# Patient Record
Sex: Male | Born: 1960 | Race: Black or African American | Hispanic: No | Marital: Married | Smoking: Current every day smoker
Health system: Southern US, Community
[De-identification: ages and names within clinical notes are randomized; demographics above are authoritative.]

## PROBLEM LIST (undated history)

## (undated) DIAGNOSIS — I10 Essential (primary) hypertension: Secondary | ICD-10-CM

## (undated) DIAGNOSIS — J45909 Unspecified asthma, uncomplicated: Secondary | ICD-10-CM

## (undated) DIAGNOSIS — M549 Dorsalgia, unspecified: Secondary | ICD-10-CM

## (undated) HISTORY — PX: APPENDECTOMY: SHX54

---

## 2018-02-01 ENCOUNTER — Ambulatory Visit
Admission: RE | Admit: 2018-02-01 | Discharge: 2018-02-01 | Disposition: A | Discharge status: Home / Self Care | Payer: Medicare Other | Source: Ambulatory Visit | Attending: Student | Admitting: Student

## 2018-02-01 ENCOUNTER — Ambulatory Visit
Admission: RE | Admit: 2018-02-01 | Discharge: 2018-02-01 | Disposition: A | Discharge status: Home / Self Care | Payer: Medicare Other | Source: Ambulatory Visit | Attending: *Deleted | Admitting: *Deleted

## 2018-02-01 ENCOUNTER — Other Ambulatory Visit: Payer: Self-pay | Admitting: *Deleted

## 2018-02-01 DIAGNOSIS — R05 Cough: Secondary | ICD-10-CM | POA: Diagnosis present

## 2018-02-01 DIAGNOSIS — J40 Bronchitis, not specified as acute or chronic: Secondary | ICD-10-CM | POA: Insufficient documentation

## 2018-02-01 DIAGNOSIS — R059 Cough, unspecified: Secondary | ICD-10-CM

## 2018-02-01 IMAGING — CR DG CHEST 2V
1 series · 2 of 2 positions shown · non-contrast
Comparison: None

CLINICAL DATA: Productive cough for 2 months, some chest pain from
coughing, smoker

EXAM:
CHEST  2 VIEW

[Series 1: dg chest 2 view · 0.14mm/px · 2 of 2 slices shown]
[im 1/2]
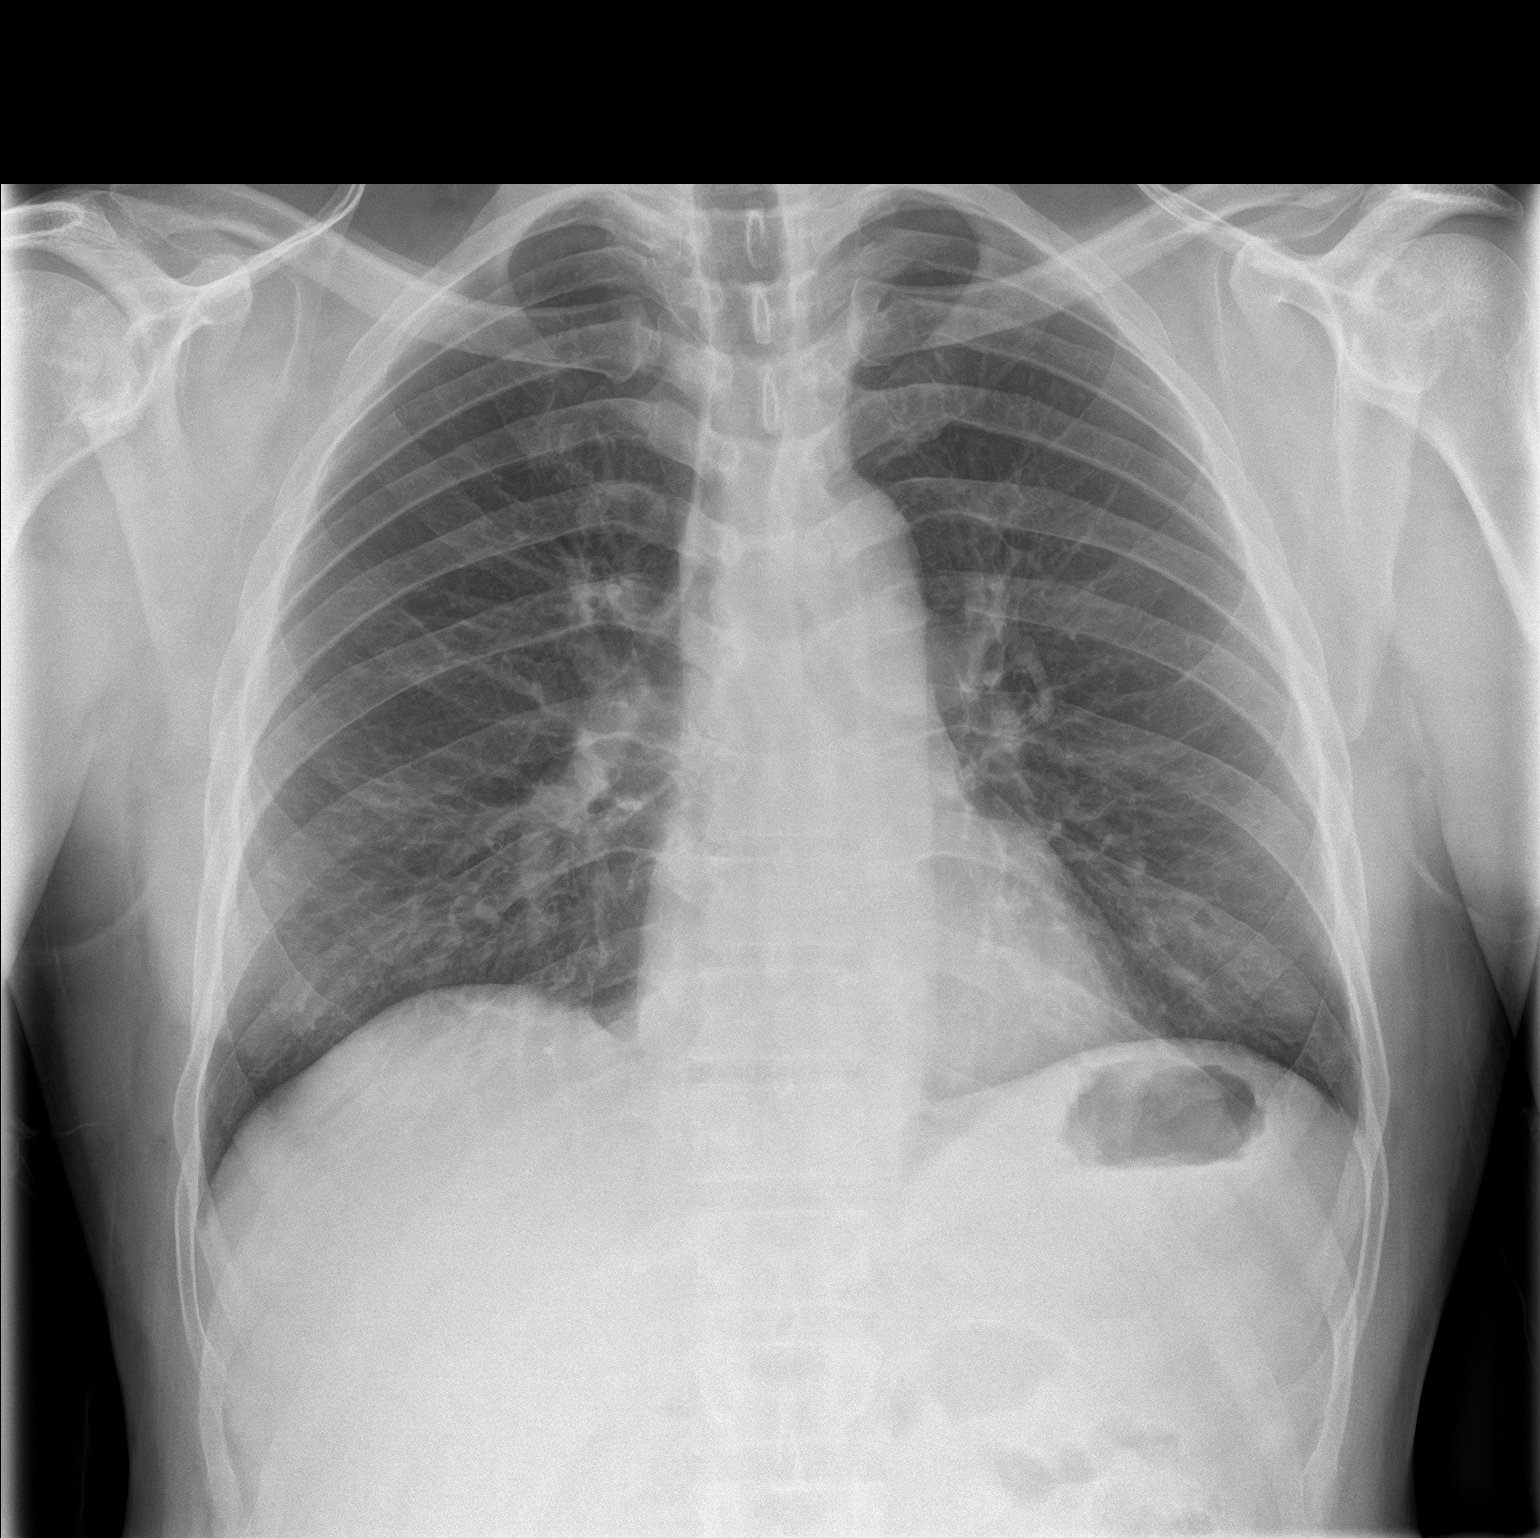
[im 2/2]
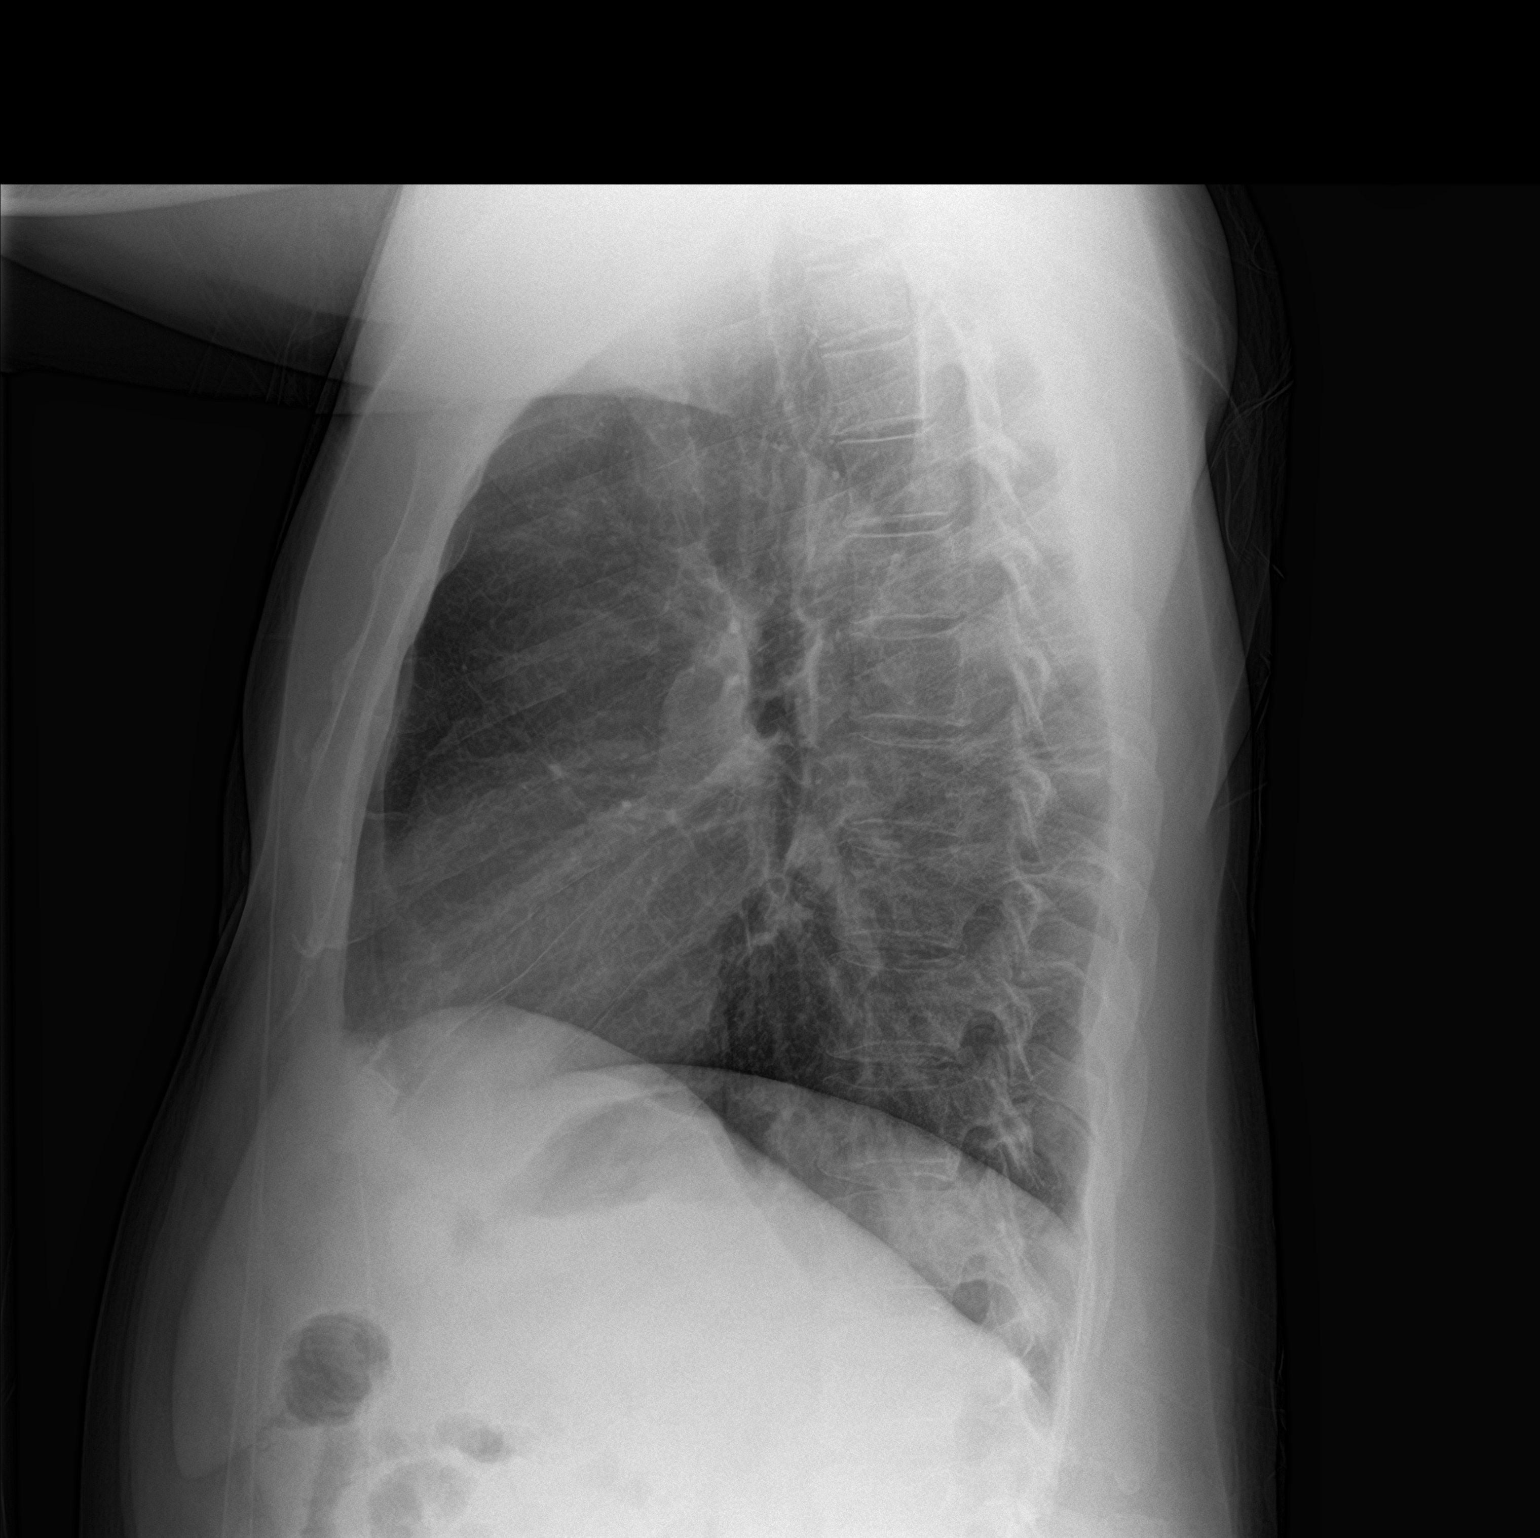

[2 of 2 positions shown; findings below may reference images not displayed]

FINDINGS: Normal heart size, mediastinal contours, and pulmonary vascularity.

Minimal peribronchial thickening.

No pulmonary infiltrate, pleural effusion, or pneumothorax.

Bones unremarkable.
IMPRESSION: Minimal bronchitic changes without infiltrate.
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
## 2019-08-18 ENCOUNTER — Emergency Department: Payer: No Typology Code available for payment source

## 2019-08-18 ENCOUNTER — Other Ambulatory Visit: Payer: Self-pay

## 2019-08-18 ENCOUNTER — Encounter: Payer: Self-pay | Admitting: Emergency Medicine

## 2019-08-18 ENCOUNTER — Emergency Department
Admission: EM | Admit: 2019-08-18 | Discharge: 2019-08-18 | Disposition: A | Discharge status: Home / Self Care | Payer: No Typology Code available for payment source | Attending: Student in an Organized Health Care Education/Training Program | Admitting: Student in an Organized Health Care Education/Training Program

## 2019-08-18 DIAGNOSIS — Y9389 Activity, other specified: Secondary | ICD-10-CM | POA: Insufficient documentation

## 2019-08-18 DIAGNOSIS — Y92410 Unspecified street and highway as the place of occurrence of the external cause: Secondary | ICD-10-CM | POA: Diagnosis not present

## 2019-08-18 DIAGNOSIS — F17210 Nicotine dependence, cigarettes, uncomplicated: Secondary | ICD-10-CM | POA: Insufficient documentation

## 2019-08-18 DIAGNOSIS — S060X1A Concussion with loss of consciousness of 30 minutes or less, initial encounter: Secondary | ICD-10-CM | POA: Insufficient documentation

## 2019-08-18 DIAGNOSIS — S0990XA Unspecified injury of head, initial encounter: Secondary | ICD-10-CM | POA: Diagnosis present

## 2019-08-18 DIAGNOSIS — S39012A Strain of muscle, fascia and tendon of lower back, initial encounter: Secondary | ICD-10-CM | POA: Insufficient documentation

## 2019-08-18 DIAGNOSIS — S161XXA Strain of muscle, fascia and tendon at neck level, initial encounter: Secondary | ICD-10-CM | POA: Insufficient documentation

## 2019-08-18 DIAGNOSIS — Y999 Unspecified external cause status: Secondary | ICD-10-CM | POA: Diagnosis not present

## 2019-08-18 DIAGNOSIS — I10 Essential (primary) hypertension: Secondary | ICD-10-CM | POA: Diagnosis not present

## 2019-08-18 HISTORY — DX: Dorsalgia, unspecified: M54.9

## 2019-08-18 HISTORY — DX: Essential (primary) hypertension: I10
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
## 2019-08-18 MED ORDER — OXYCODONE-ACETAMINOPHEN 5-325 MG PO TABS
1.0000 | ORAL_TABLET | Freq: Once | ORAL | Status: AC
  Administered 2019-08-18: 11:00:00 1 via ORAL
  Filled 2019-08-18: qty 1

## 2019-08-18 MED ORDER — MELOXICAM 15 MG PO TABS: 15.0000 mg | ORAL_TABLET | Freq: Every day | ORAL | 2 refills | Status: AC

## 2019-08-18 MED ORDER — METHOCARBAMOL 500 MG PO TABS: 500.0000 mg | ORAL_TABLET | Freq: Four times a day (QID) | ORAL | 0 refills | Status: DC
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
## 2019-08-18 NOTE — Discharge Instructions (Signed)
Follow-up with your regular doctor.  Return emergency department worsening.  Apply ice to all areas that hurt.  Take your medications as prescribed.  Follow-up with orthopedics if your neck and back or not better in a few days or your regular physician.
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
## 2019-08-21 ENCOUNTER — Encounter: Payer: Self-pay | Admitting: Emergency Medicine

## 2019-08-21 ENCOUNTER — Other Ambulatory Visit: Payer: Self-pay

## 2019-08-21 ENCOUNTER — Emergency Department
Admission: EM | Admit: 2019-08-21 | Discharge: 2019-08-21 | Disposition: A | Discharge status: Home / Self Care | Payer: No Typology Code available for payment source | Attending: Emergency Medicine | Admitting: Emergency Medicine

## 2019-08-21 DIAGNOSIS — S161XXD Strain of muscle, fascia and tendon at neck level, subsequent encounter: Secondary | ICD-10-CM

## 2019-08-21 DIAGNOSIS — S3992XA Unspecified injury of lower back, initial encounter: Secondary | ICD-10-CM | POA: Diagnosis present

## 2019-08-21 DIAGNOSIS — I10 Essential (primary) hypertension: Secondary | ICD-10-CM | POA: Diagnosis not present

## 2019-08-21 DIAGNOSIS — S161XXA Strain of muscle, fascia and tendon at neck level, initial encounter: Secondary | ICD-10-CM | POA: Diagnosis not present

## 2019-08-21 DIAGNOSIS — F17210 Nicotine dependence, cigarettes, uncomplicated: Secondary | ICD-10-CM | POA: Diagnosis not present

## 2019-08-21 DIAGNOSIS — Y999 Unspecified external cause status: Secondary | ICD-10-CM | POA: Diagnosis not present

## 2019-08-21 DIAGNOSIS — S339XXA Sprain of unspecified parts of lumbar spine and pelvis, initial encounter: Secondary | ICD-10-CM | POA: Diagnosis not present

## 2019-08-21 DIAGNOSIS — Z79899 Other long term (current) drug therapy: Secondary | ICD-10-CM | POA: Insufficient documentation

## 2019-08-21 DIAGNOSIS — S39012D Strain of muscle, fascia and tendon of lower back, subsequent encounter: Secondary | ICD-10-CM

## 2019-08-21 DIAGNOSIS — Y92410 Unspecified street and highway as the place of occurrence of the external cause: Secondary | ICD-10-CM | POA: Diagnosis not present

## 2019-08-21 DIAGNOSIS — Y93I9 Activity, other involving external motion: Secondary | ICD-10-CM | POA: Insufficient documentation

## 2019-08-21 MED ORDER — TIZANIDINE HCL 2 MG PO TABS: 2.0000 mg | ORAL_TABLET | Freq: Four times a day (QID) | ORAL | 0 refills | Status: DC | PRN

## 2019-08-21 MED ORDER — OXYCODONE-ACETAMINOPHEN 5-325 MG PO TABS
2.0000 | ORAL_TABLET | Freq: Once | ORAL | Status: AC
  Administered 2019-08-21: 2 via ORAL
  Filled 2019-08-21: qty 2

## 2019-08-21 MED ORDER — ORPHENADRINE CITRATE 30 MG/ML IJ SOLN
60.0000 mg | Freq: Two times a day (BID) | INTRAMUSCULAR | Status: DC
  Administered 2019-08-21: 60 mg via INTRAMUSCULAR
  Filled 2019-08-21: qty 2

## 2019-08-21 MED ORDER — PREDNISONE 10 MG PO TABS: ORAL_TABLET | ORAL | 0 refills | Status: DC

## 2019-08-21 MED ORDER — HYDROCODONE-ACETAMINOPHEN 5-325 MG PO TABS: 1.0000 | ORAL_TABLET | Freq: Four times a day (QID) | ORAL | 0 refills | Status: DC | PRN

## 2019-08-21 MED ORDER — DEXAMETHASONE SODIUM PHOSPHATE 10 MG/ML IJ SOLN
10.0000 mg | Freq: Once | INTRAMUSCULAR | Status: AC
  Administered 2019-08-21: 10 mg via INTRAMUSCULAR
  Filled 2019-08-21: qty 1

## 2019-08-21 NOTE — ED Notes (Signed)

## 2019-08-21 NOTE — ED Triage Notes (Signed)
Pt reports denies loss of bowel or bladder. Pt reports hurts to look left or right as well.
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
## 2019-08-21 NOTE — Discharge Instructions (Signed)
Continue regular medication.  Follow-up with [facility] acute care if any continued problems.  Discontinue taking the Robaxin (methocarbamol 500 mg) that you have at home.  Begin taking prednisone 30 mg once a day for the next 4 days.  Zanaflex 2 mg 1 every 6 hours as needed for muscle spasms and hydrocodone 1 every 6 hours as needed for pain.  Use moist heat or ice to your muscles as needed for discomfort.

## 2019-12-06 ENCOUNTER — Other Ambulatory Visit: Payer: Self-pay | Admitting: Specialist

## 2019-12-06 DIAGNOSIS — M5412 Radiculopathy, cervical region: Secondary | ICD-10-CM

## 2019-12-06 DIAGNOSIS — M5416 Radiculopathy, lumbar region: Secondary | ICD-10-CM

## 2019-12-20 ENCOUNTER — Ambulatory Visit: Payer: Medicare Other

## 2020-01-03 ENCOUNTER — Ambulatory Visit
Admission: RE | Admit: 2020-01-03 | Discharge: 2020-01-03 | Disposition: A | Discharge status: Home / Self Care | Payer: Medicare Other | Source: Ambulatory Visit | Attending: Specialist | Admitting: Specialist

## 2020-01-03 ENCOUNTER — Other Ambulatory Visit: Payer: Self-pay

## 2020-01-03 DIAGNOSIS — M5412 Radiculopathy, cervical region: Secondary | ICD-10-CM | POA: Insufficient documentation

## 2020-01-03 DIAGNOSIS — M5416 Radiculopathy, lumbar region: Secondary | ICD-10-CM

## 2020-01-03 IMAGING — MR MR CERVICAL SPINE W/O CM
5 series · 40 of 48 positions shown · non-contrast
Comparison: None.

CLINICAL DATA: Left-sided neck pain and shoulder pain, low back
pain radiating into the right leg

EXAM:
MRI CERVICAL SPINE WITHOUT CONTRAST
TECHNIQUE: Multiplanar, multisequence MR imaging of the cervical spine was
performed. No intravenous contrast was administered.

[Series 5: T2 · sagittal · 3.0mm · 0.62mm/px · 6 of 15 slices shown (1 of 2)]
[im 1/15]
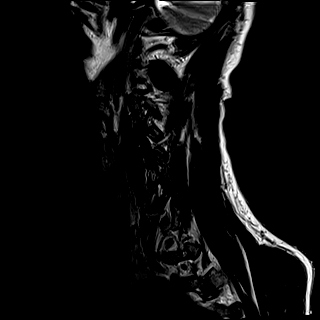
[im 3/15]
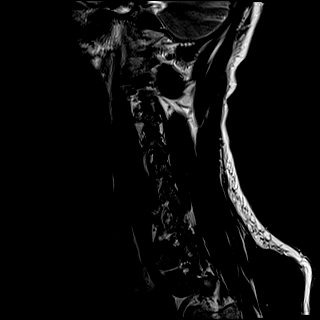
[im 6/15]
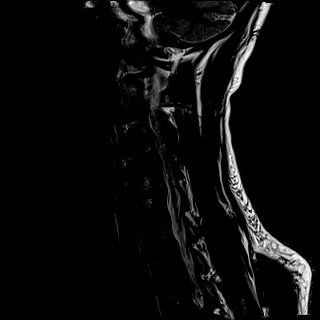
[im 9/15]
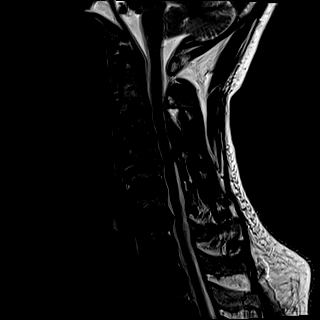
[im 12/15]
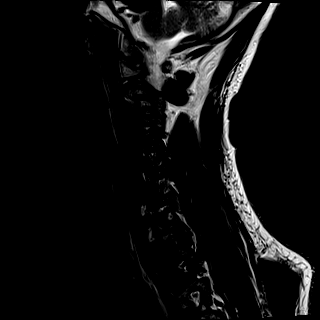
[im 15/15]
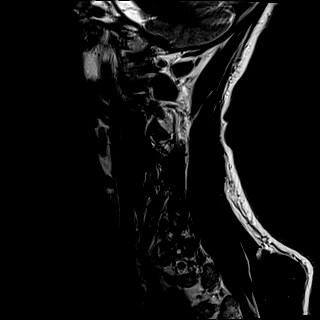

[Series 6: FLAIR · sagittal · 3.0mm · 0.78mm/px · 7 of 15 slices shown]
[im 1/15]
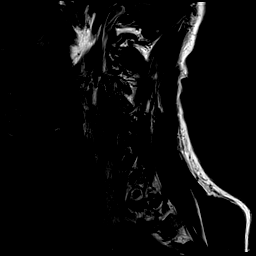
[im 3/15]
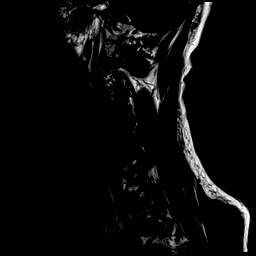
[im 5/15]
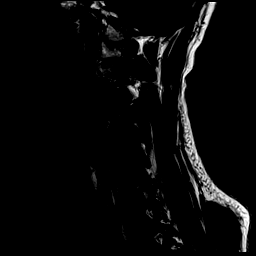
[im 8/15]
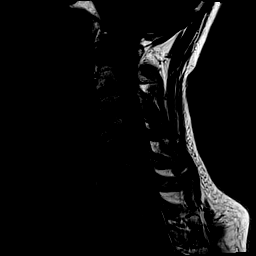
[im 10/15]
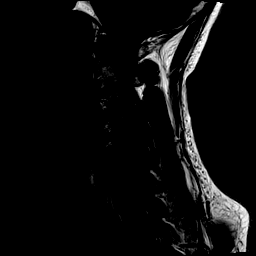
[im 12/15]
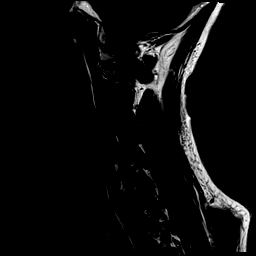
[im 15/15]
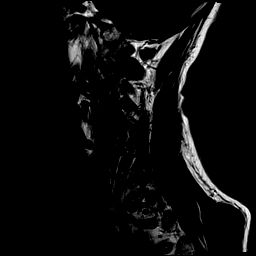

[Series 7: STIR · sagittal · 3.0mm · 0.62mm/px · 7 of 15 slices shown]
[im 1/15]
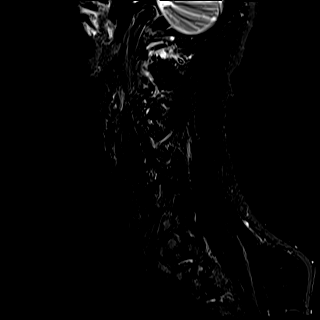
[im 3/15]
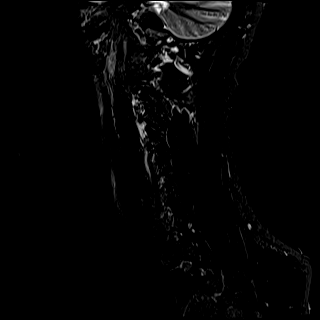
[im 5/15]
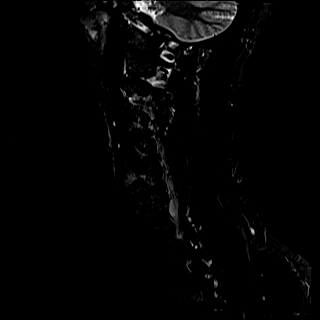
[im 8/15]
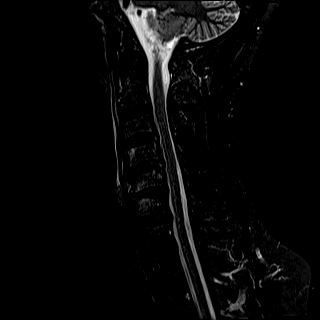
[im 10/15]
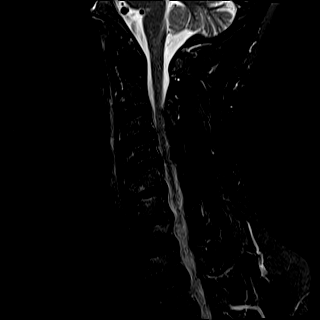
[im 12/15]
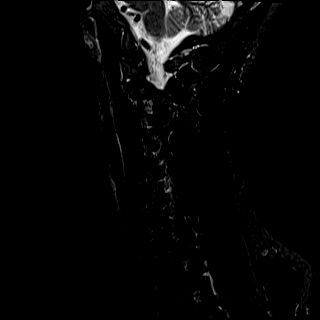
[im 15/15]
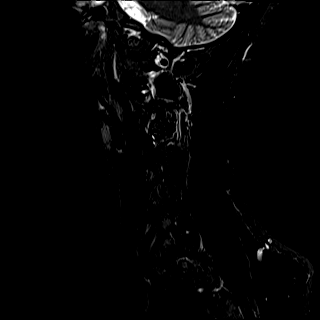

[Series 8: T2 · axial · 3.0mm · 0.70mm/px · z∈[-142,-38]mm · 12 of 31 slices shown (2 of 2)]
[im 1/31]
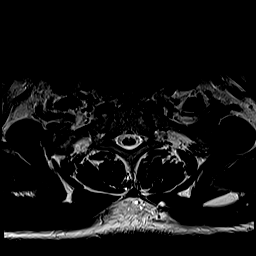
[im 3/31]
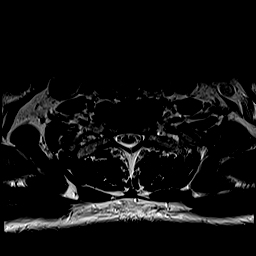
[im 5/31]
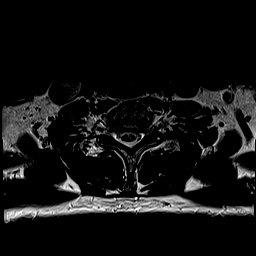
[im 7/31]
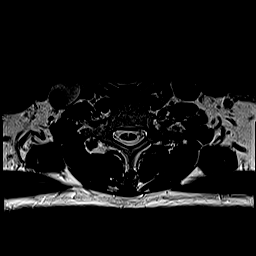
[im 10/31]
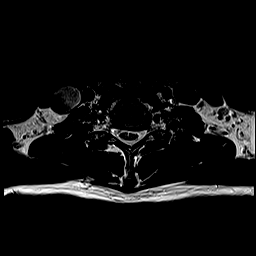
[im 12/31]
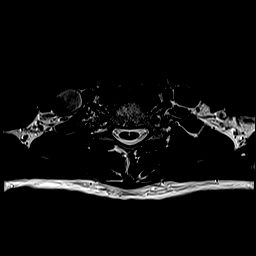
[im 14/31]
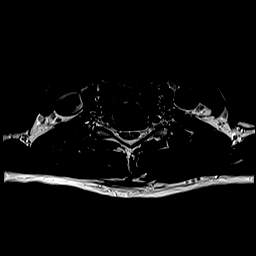
[im 17/31]
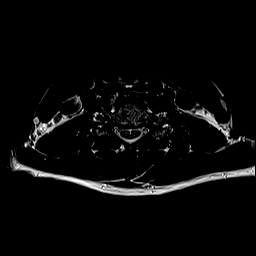
[im 19/31]
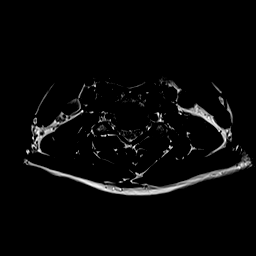
[im 21/31]
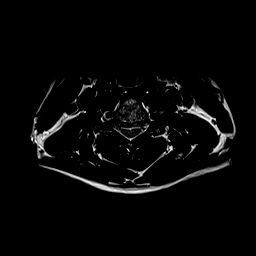
[im 26/31]
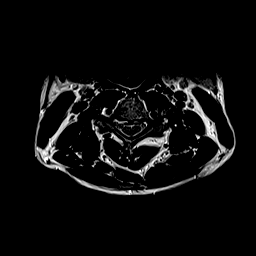
[im 31/31]
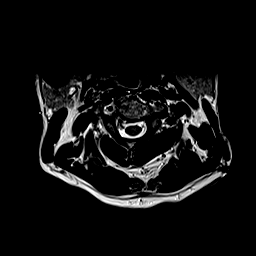

[Series 9: ax mpgr · axial · 3.0mm · 0.35mm/px · z∈[-142,-38]mm · 8 of 31 slices shown]
[im 1/31]
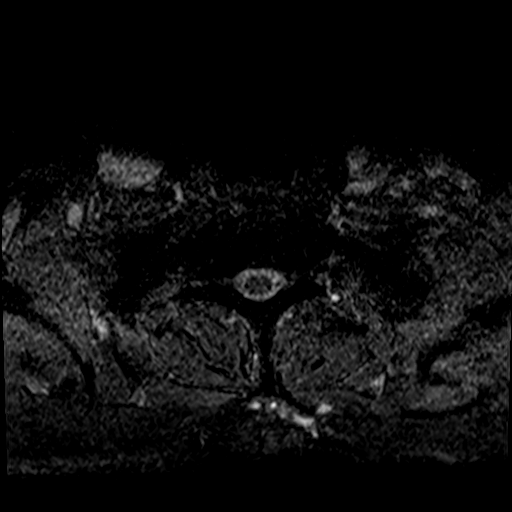
[im 5/31]
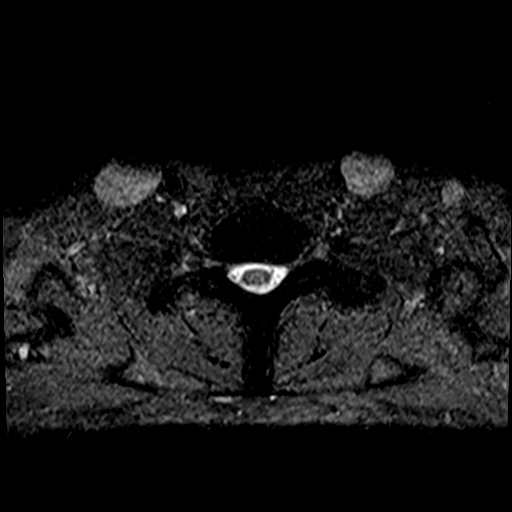
[im 10/31]
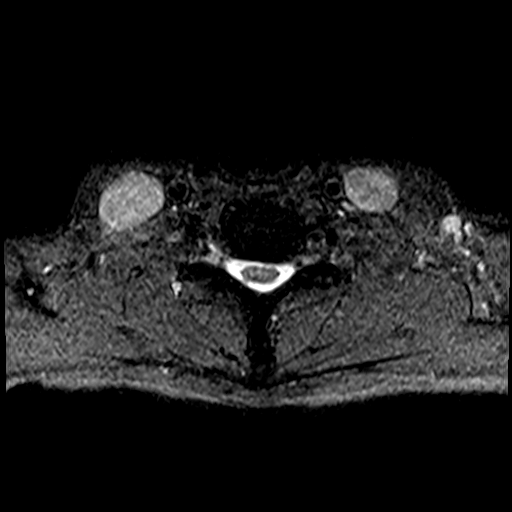
[im 14/31]
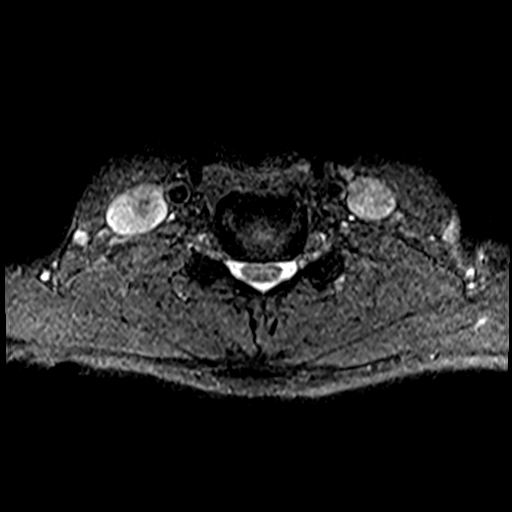
[im 17/31]
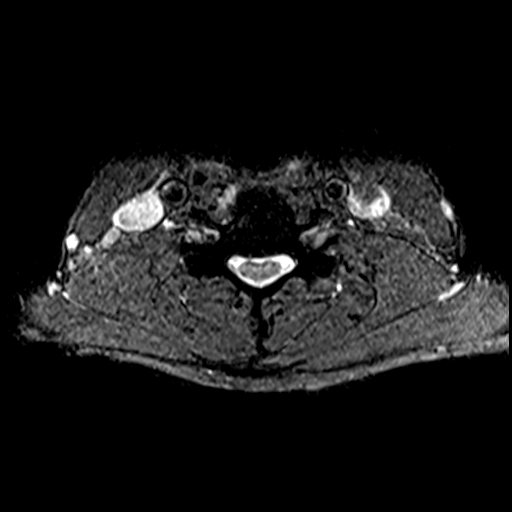
[im 21/31]
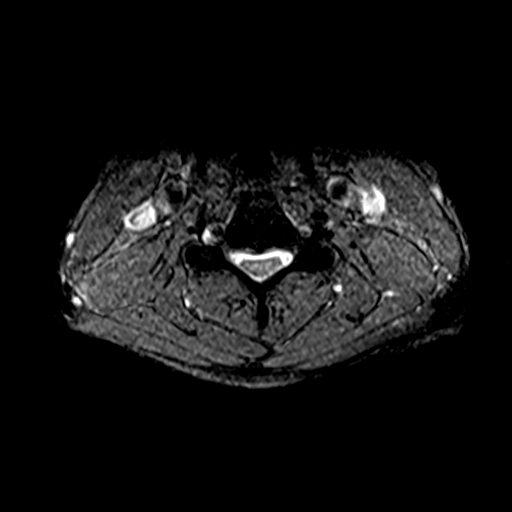
[im 26/31]
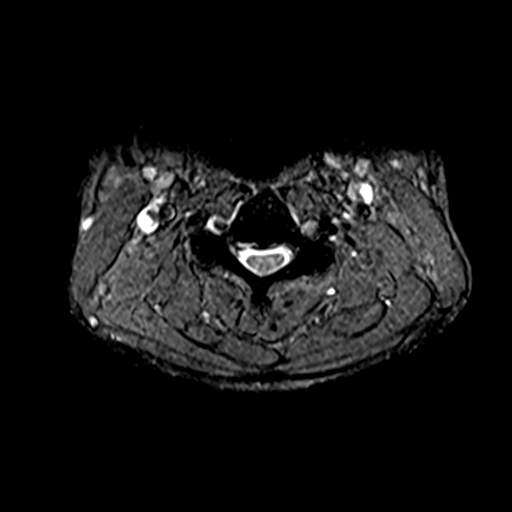
[im 31/31]
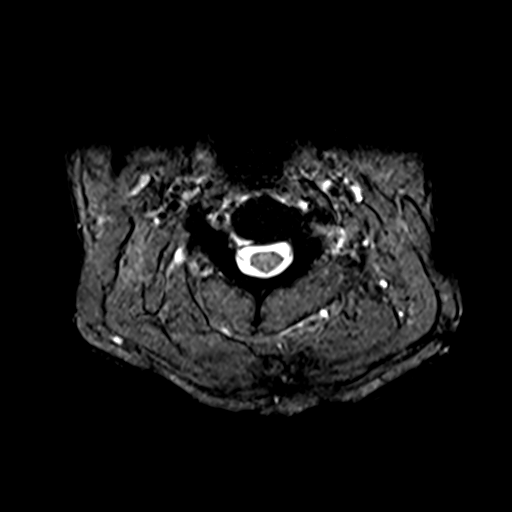

[40 of 48 positions shown; findings below may reference images not displayed]

FINDINGS: Alignment: Physiologic.

Vertebrae: No fracture, evidence of discitis, or bone lesion.

Cord: Normal signal and morphology.

Posterior Fossa, vertebral arteries, paraspinal tissues: Posterior
fossa demonstrates no focal abnormality. Vertebral artery flow voids
are maintained. Paraspinal soft tissues are unremarkable.

Disc levels:

Discs: Disc spaces are maintained.

C2-3: No significant disc bulge. No neural foraminal stenosis. No
central canal stenosis.

C3-4: Broad-based disc bulge. Moderate left and mild right foraminal
narrowing. No central canal stenosis.

C4-5: Broad-based disc bulge with a shallow right paracentral disc
protrusion. Moderate bilateral foraminal stenosis. No central canal
stenosis.

C5-6: Mild broad-based disc bulge. No neural foraminal stenosis. No
central canal stenosis.

C6-7: Small left paracentral disc protrusion. No neural foraminal
stenosis. No central canal stenosis.

C7-T1: No significant disc bulge. No neural foraminal stenosis. No
central canal stenosis.
IMPRESSION: 1. Mild cervical spine spondylosis as described above.
2.  No acute osseous injury of the cervical spine.
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
## 2020-02-13 ENCOUNTER — Other Ambulatory Visit: Payer: Self-pay | Admitting: Urology

## 2020-02-13 DIAGNOSIS — R3129 Other microscopic hematuria: Secondary | ICD-10-CM

## 2020-02-19 ENCOUNTER — Other Ambulatory Visit: Payer: Self-pay | Admitting: Orthopedic Surgery

## 2020-02-19 DIAGNOSIS — M899 Disorder of bone, unspecified: Secondary | ICD-10-CM

## 2020-02-19 DIAGNOSIS — M25512 Pain in left shoulder: Secondary | ICD-10-CM

## 2020-02-22 ENCOUNTER — Ambulatory Visit: Admission: RE | Admit: 2020-02-22 | Payer: Medicare Other | Source: Ambulatory Visit

## 2020-02-27 ENCOUNTER — Other Ambulatory Visit: Payer: Self-pay

## 2020-02-27 ENCOUNTER — Ambulatory Visit
Admission: RE | Admit: 2020-02-27 | Discharge: 2020-02-27 | Disposition: A | Discharge status: Home / Self Care | Payer: Medicare Other | Source: Ambulatory Visit | Attending: Urology | Admitting: Urology

## 2020-02-27 DIAGNOSIS — R3129 Other microscopic hematuria: Secondary | ICD-10-CM | POA: Insufficient documentation

## 2020-02-27 HISTORY — DX: Unspecified asthma, uncomplicated: J45.909

## 2020-02-27 IMAGING — CT CT ABD-PEL WO/W CM
2 of 5 series · 13 of 32 positions shown, 18 images · IV contrast (APPLIED)
Comparison: None.

CLINICAL DATA: Microhematuria.  BPH.  Prior appendectomy.

EXAM:
CT ABDOMEN AND PELVIS WITHOUT AND WITH CONTRAST
TECHNIQUE: Multidetector CT imaging of the abdomen and pelvis was performed
following the standard protocol before and following the bolus
administration of intravenous contrast.
CONTRAST:  125mL OMNIPAQUE IOHEXOL 300 MG/ML  SOLN

[Series 2: axial pre · axial · non-contrast · 0.73mm/px · z∈[-894,-534]mm · 7 of 98 slices shown, 12 images]
[im 13/98  soft-tissue]
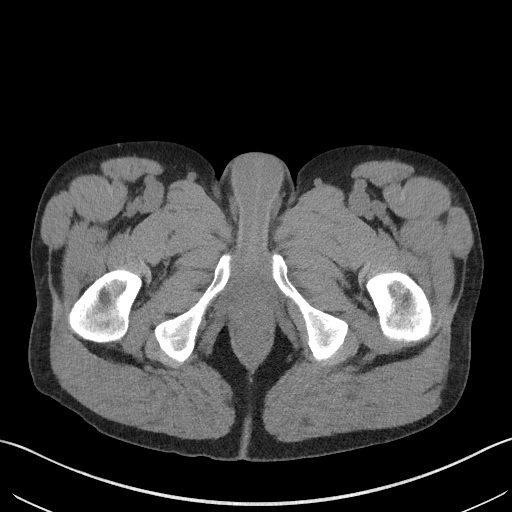
[im 13/98  bone]
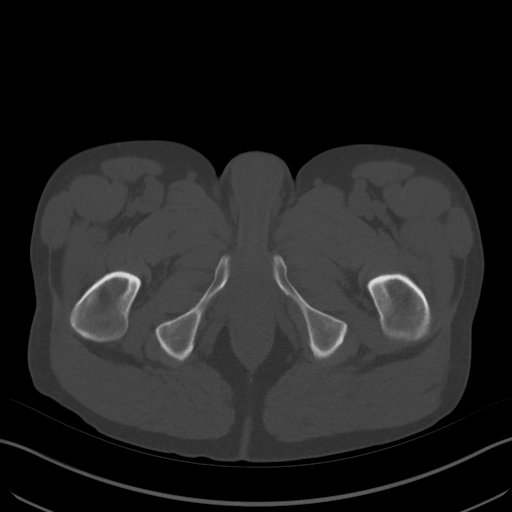
[im 25/98  soft-tissue]
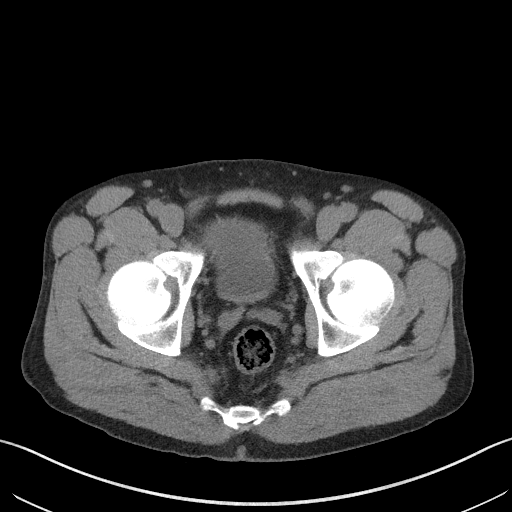
[im 37/98  soft-tissue]
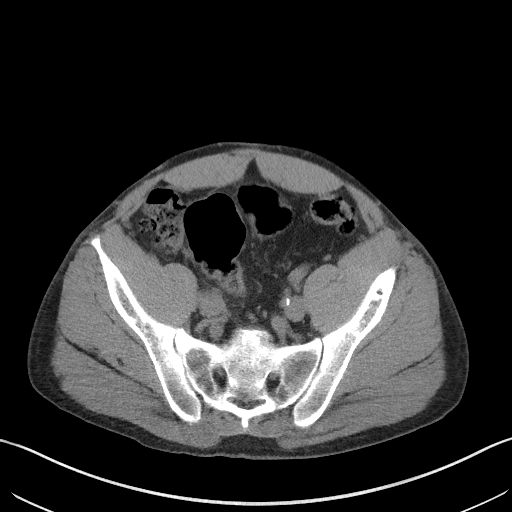
[im 49/98  soft-tissue]
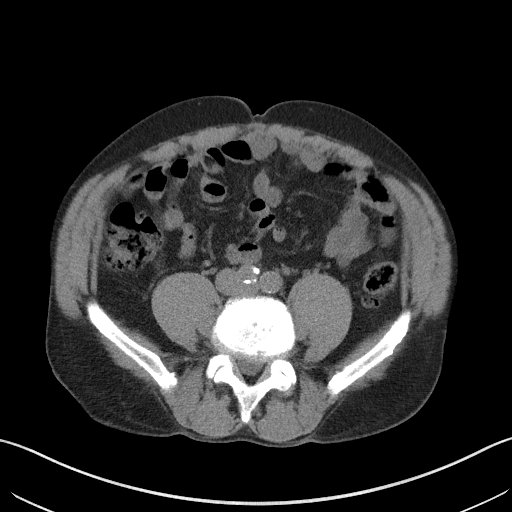
[im 49/98  lung]
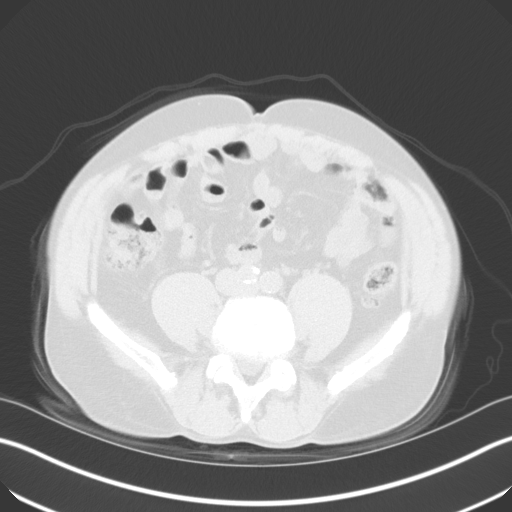
[im 61/98  soft-tissue]
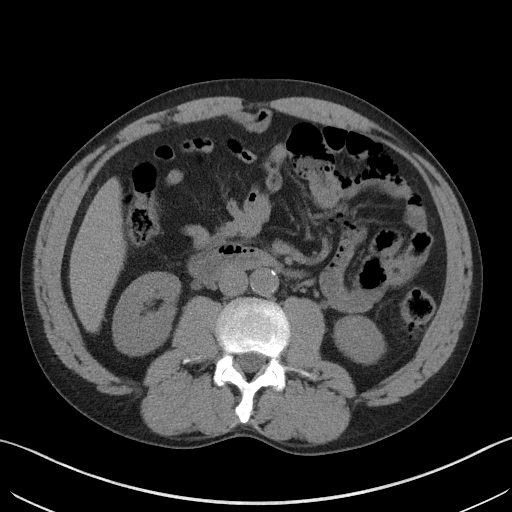
[im 61/98  lung]
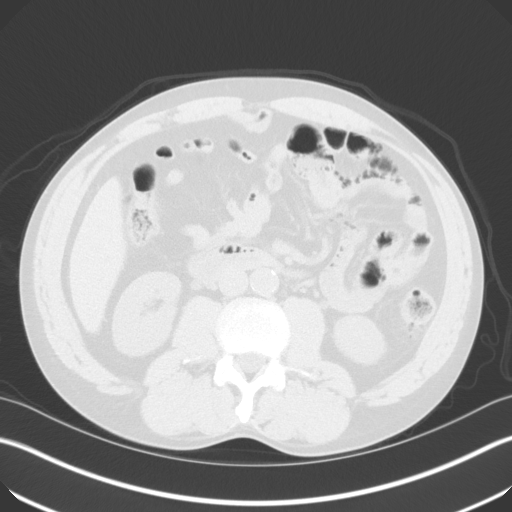
[im 73/98  soft-tissue]
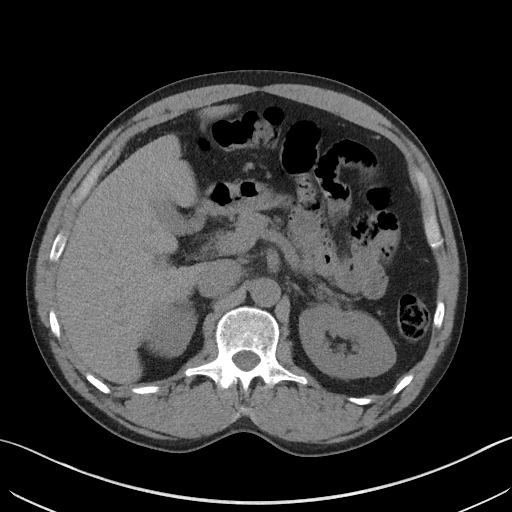
[im 73/98  lung]
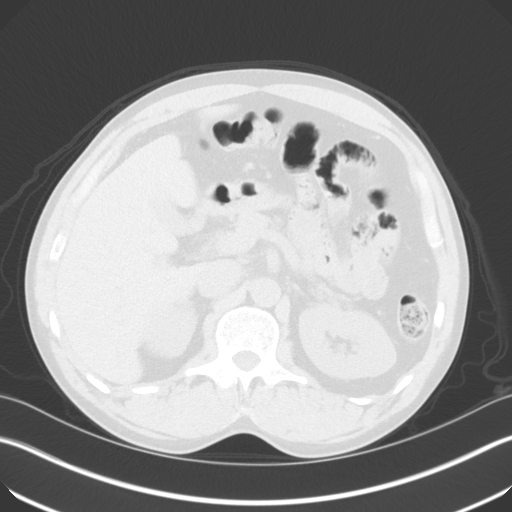
[im 85/98  soft-tissue]
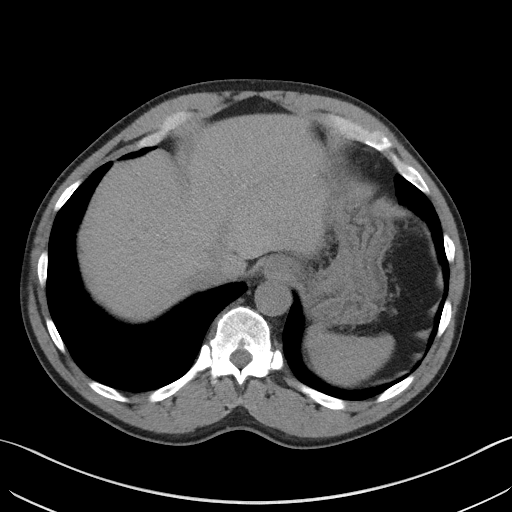
[im 85/98  lung]
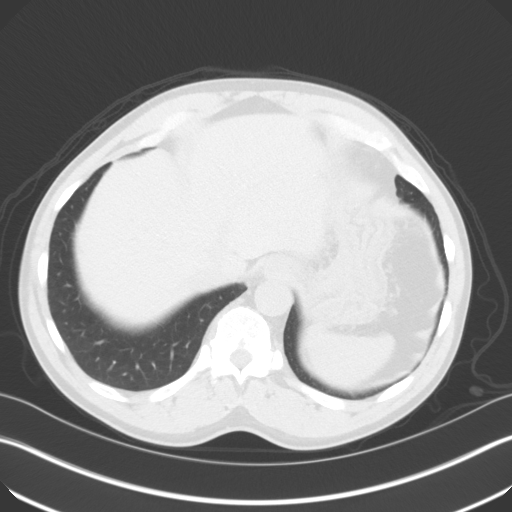

[Series 4: axial post · axial · 0.73mm/px · z∈[-894,-594]mm · 6 of 98 slices shown]
[im 13/98  soft-tissue]
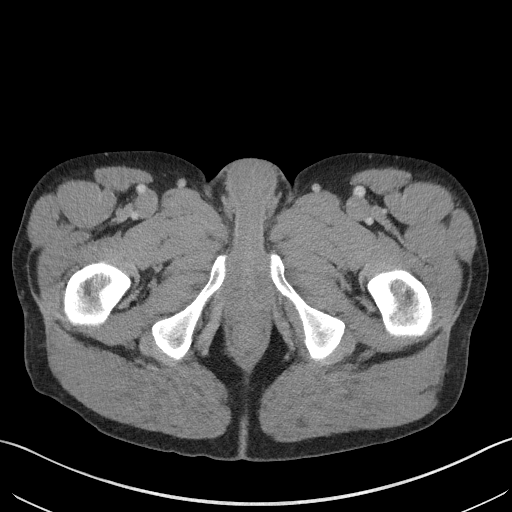
[im 25/98  soft-tissue]
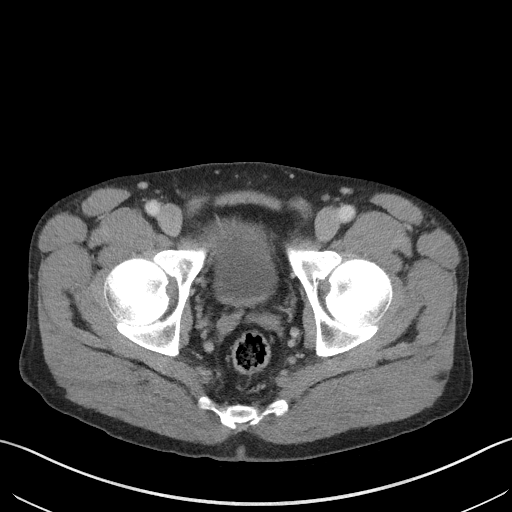
[im 37/98  soft-tissue]
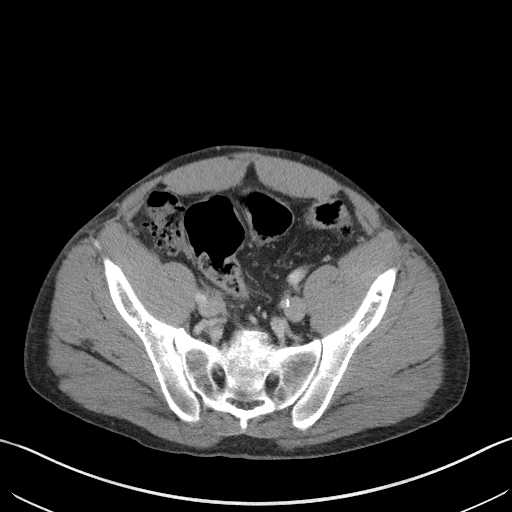
[im 49/98  soft-tissue]
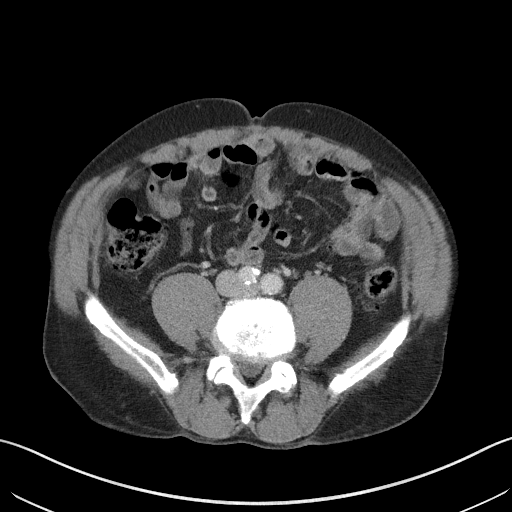
[im 61/98  soft-tissue]
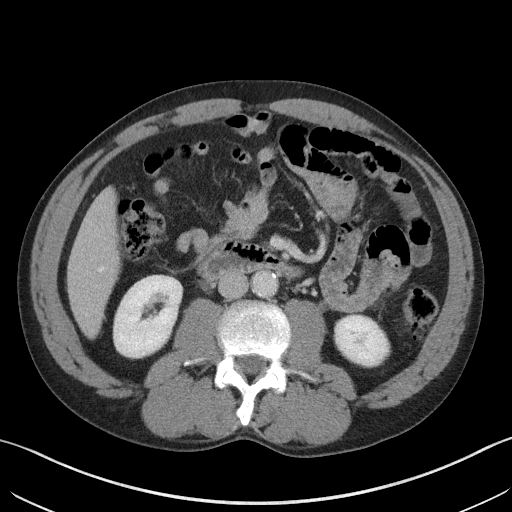
[im 73/98  soft-tissue]
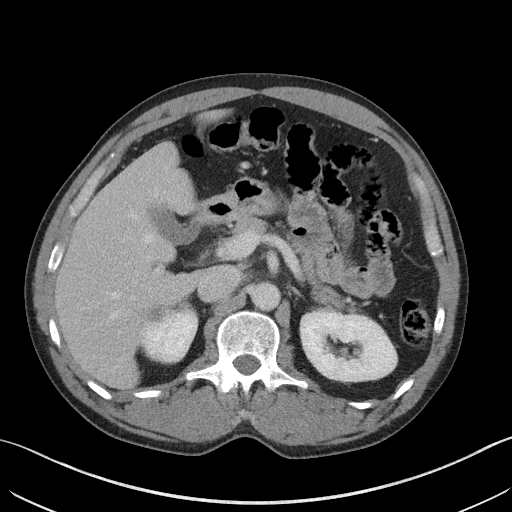

[13 of 32 positions shown; findings below may reference images not displayed]

FINDINGS: Lower chest: Lung bases are clear.

Hepatobiliary: Subcentimeter cyst and calcified granuloma in the
right hepatic lobe.

Gallbladder is unremarkable. No intrahepatic or extrahepatic ductal
dilatation.

Pancreas: Within normal limits.

Spleen: Within normal limits.

Adrenals/Urinary Tract: Adrenal glands are within normal limits.

16 mm right upper pole renal cyst (series 4/image 28), benign
(Bosniak I). No enhancing renal lesions.

No renal, ureteral, or bladder calculi. No hydronephrosis.

On delayed imaging, there are no filling defects in the bilateral
opacified proximal collecting systems, ureters, or bladder.

Bladder is within normal limits.

Stomach/Bowel: Stomach is within normal limits.

No evidence of bowel obstruction.

Prior appendectomy.

Scattered colonic diverticulosis, without evidence of
diverticulitis.

Vascular/Lymphatic: No evidence of abdominal aortic aneurysm.

Atherosclerotic calcifications of the abdominal aorta and branch
vessels.

No suspicious abdominopelvic lymphadenopathy. Small right inguinal
nodes measuring up to 8 mm short axis (series 4/image 77), within
normal limits.

Reproductive: Prostate is grossly unremarkable on CT.

Other: No abdominopelvic ascites.

Musculoskeletal: Degenerative changes of the lumbar spine, most
prominent at L4-5.

10 mm nonaggressive lesion in the left iliac bone with thin
sclerotic rim (series 4/image 62), indeterminate but likely benign,
without the characteristic appearance of a sclerotic osseous
metastasis.
IMPRESSION: 1.6 cm right upper pole renal cyst, benign (Bosniak I). No enhancing
renal lesions.

Otherwise, no CT findings to account for the patient's
microhematuria.

Prostate is grossly unremarkable on CT. No suspicious pelvic
lymphadenopathy.

10 mm nonaggressive lesion the left iliac bone, indeterminate but
likely benign, without the characteristic appearance of a sclerotic
osseous metastasis.

## 2020-02-27 MED ORDER — IOHEXOL 300 MG/ML  SOLN
150.0000 mL | Freq: Once | INTRAMUSCULAR | Status: AC | PRN
  Administered 2020-02-27: 125 mL via INTRAVENOUS

## 2020-02-29 ENCOUNTER — Ambulatory Visit: Admission: RE | Admit: 2020-02-29 | Payer: Medicare Other | Source: Ambulatory Visit

## 2020-03-28 ENCOUNTER — Ambulatory Visit
Admission: RE | Admit: 2020-03-28 | Discharge: 2020-03-28 | Disposition: A | Discharge status: Home / Self Care | Payer: Medicare Other | Source: Ambulatory Visit | Attending: Orthopedic Surgery | Admitting: Orthopedic Surgery

## 2020-03-28 ENCOUNTER — Other Ambulatory Visit: Payer: Self-pay

## 2020-03-28 DIAGNOSIS — M25512 Pain in left shoulder: Secondary | ICD-10-CM

## 2020-03-28 DIAGNOSIS — M899 Disorder of bone, unspecified: Secondary | ICD-10-CM

## 2020-03-28 IMAGING — MR MR SHOULDER*L* W/O CM
4 of 5 series · 31 of 40 positions shown · non-contrast
Comparison: None.

CLINICAL DATA: Left shoulder pain and painful range of motion for
2-3 months.

EXAM:
MRI OF THE LEFT SHOULDER WITHOUT CONTRAST
TECHNIQUE: Multiplanar, multisequence MR imaging of the shoulder was performed.
No intravenous contrast was administered.

[Series 5: PD fat-sat · axial · left · 4.0mm · 0.55mm/px · z∈[-28,+101]mm · 8 of 28 slices shown (1 of 2)]
[im 1/28]
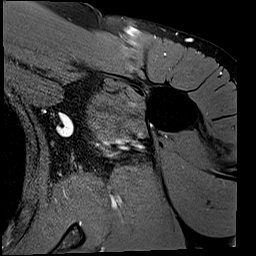
[im 4/28]
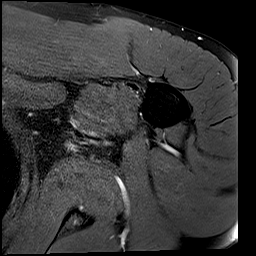
[im 10/28]
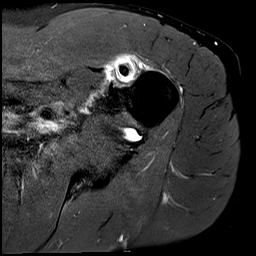
[im 13/28]
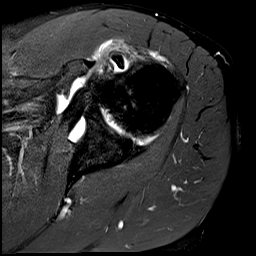
[im 16/28]
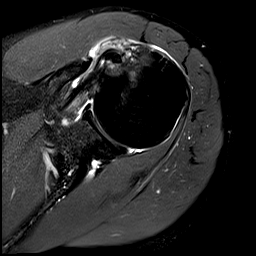
[im 19/28]
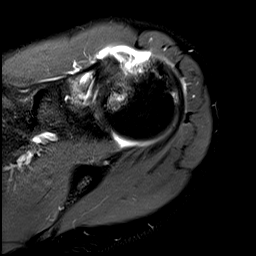
[im 25/28]
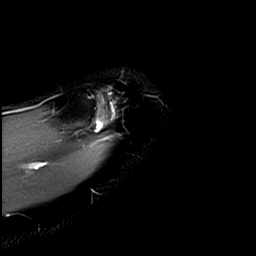
[im 28/28]
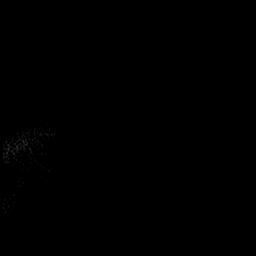

[Series 6: PD fat-sat · oblique · left · 4.0mm · 0.44mm/px · 8 of 26 slices shown (2 of 2)]
[im 1/26]
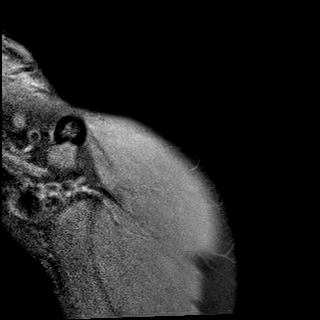
[im 4/26]
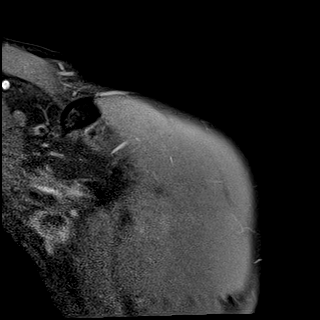
[im 8/26]
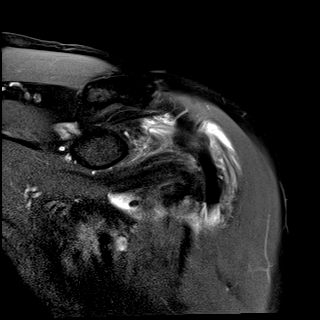
[im 11/26]
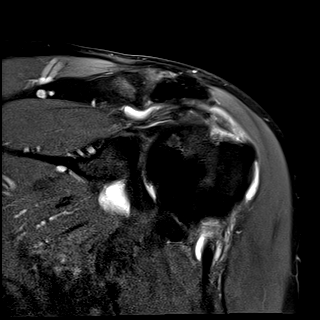
[im 15/26]
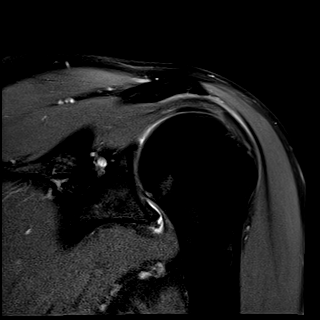
[im 18/26]
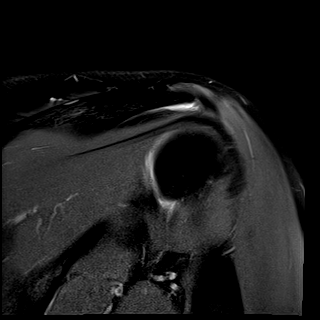
[im 22/26]
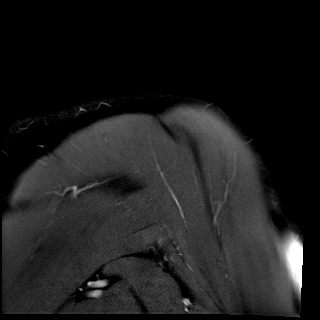
[im 26/26]
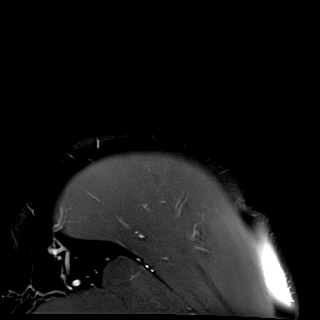

[Series 7: T2 fat-sat · oblique · left · 4.0mm · 0.44mm/px · 8 of 26 slices shown (1 of 2)]
[im 1/26]
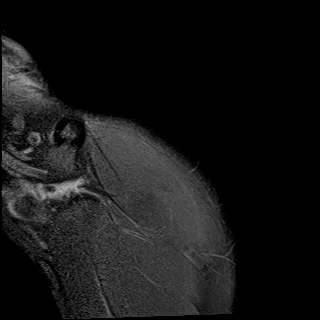
[im 4/26]
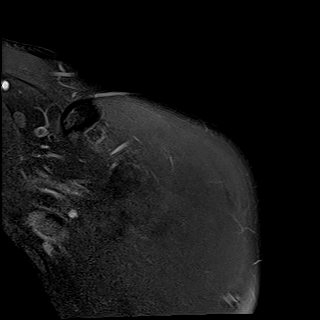
[im 8/26]
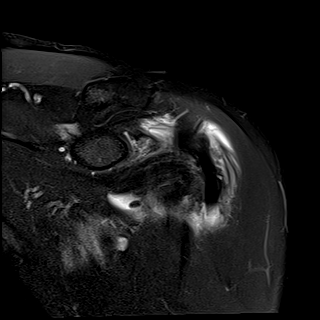
[im 11/26]
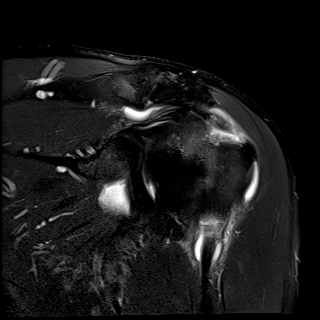
[im 15/26]
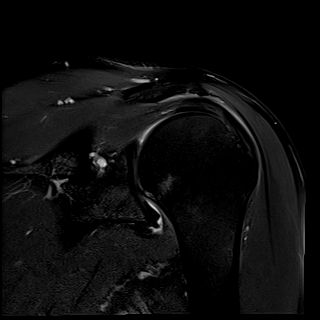
[im 18/26]
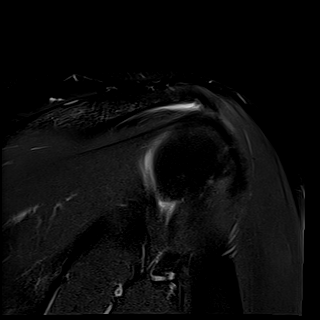
[im 22/26]
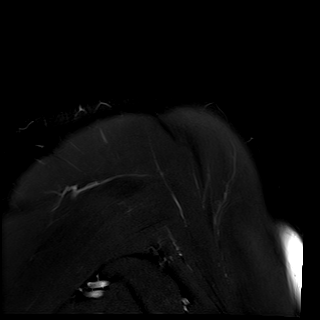
[im 26/26]
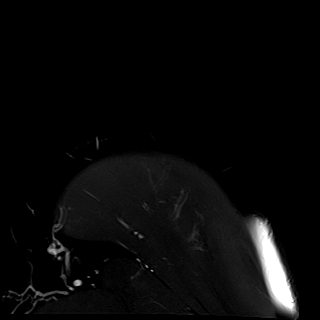

[Series 8: T2 fat-sat · oblique · left · 4.0mm · 0.23mm/px · 7 of 22 slices shown (2 of 2)]
[im 1/22]
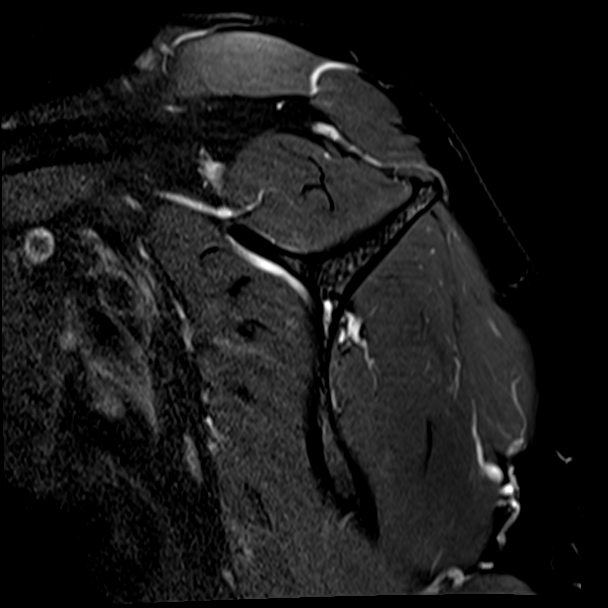
[im 4/22]
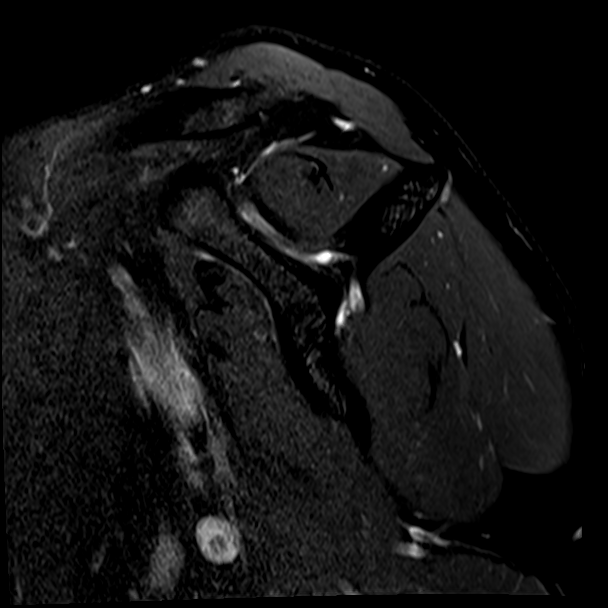
[im 8/22]
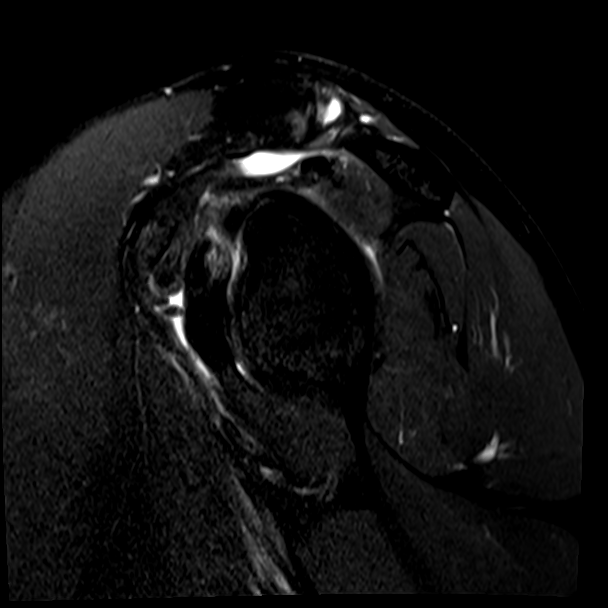
[im 11/22]
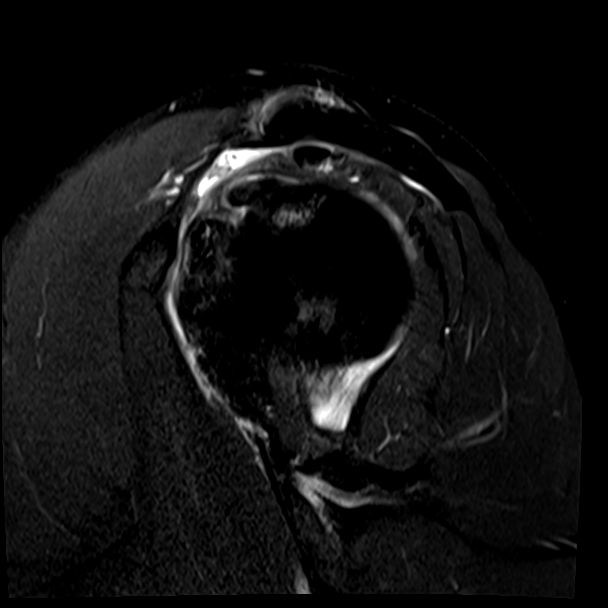
[im 15/22]
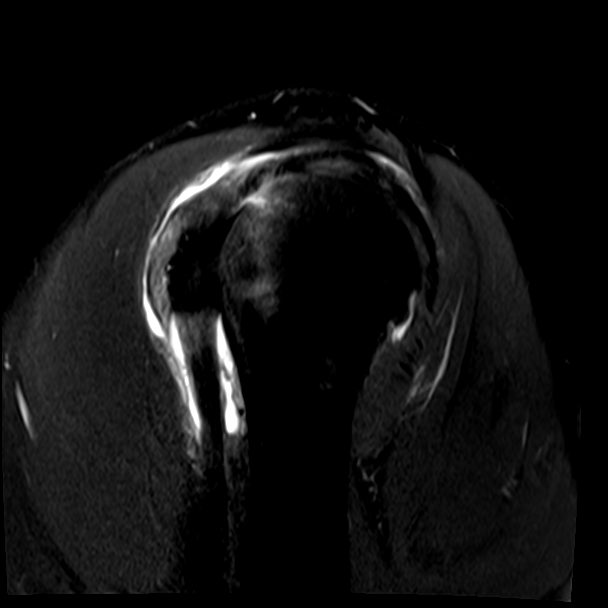
[im 18/22]
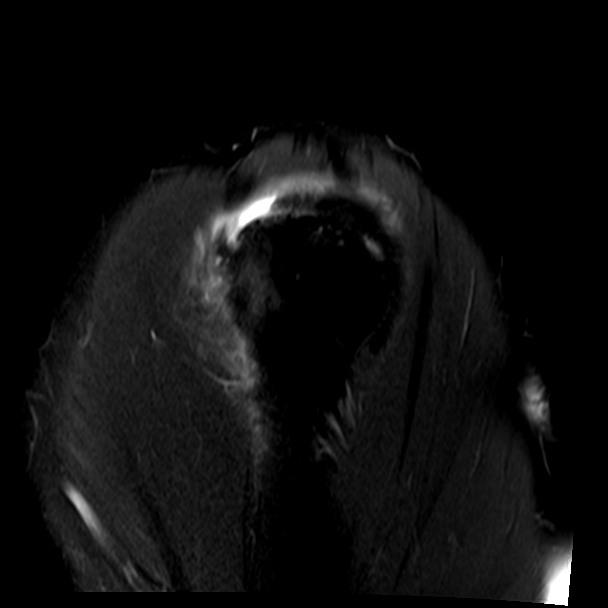
[im 22/22]
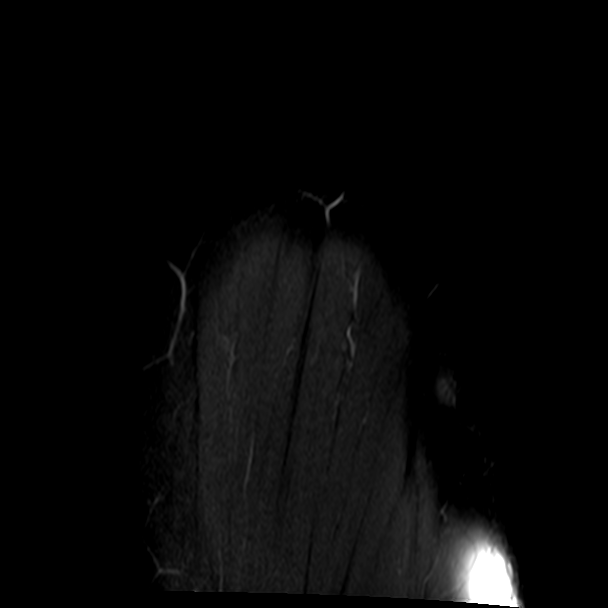

[31 of 40 positions shown; findings below may reference images not displayed]

FINDINGS: Rotator cuff: There is a focal 14 mm wide full-thickness non
retracted tear of the anterior aspect of the distal supraspinatus
tendon. There is a tiny intrasubstance tear of the distal
subscapularis tendon. The infraspinatus and teres minor tendons are
normal.

Muscles: No atrophy or abnormal signal of the muscles of the rotator
cuff.

Biceps long head: There is slight subluxation of the long head of
the biceps tendon at the top of the bicipital groove hypertrophy and
degeneration and irregularity of the soft tissues overlying the
proximal biceps tendon including the transverse humeral ligament.
This is properly related to the adjacent supraspinatus tear and
degeneration of the distal subscapularis tendon.

Acromioclavicular Joint: Minimal degenerative changes of the AC
joint. Type 2 acromion.

Glenohumeral Joint: Moderate glenohumeral joint effusion. No
chondral defect. There is a small loose body in the anterior aspect
of the subdeltoid bursa superficial to the subscapularis tendon

Labrum:  Intact.

Bones: Small bone infarct in the femoral head of unknown etiology.
Otherwise negative.

Other: None
IMPRESSION: 1. Focal full-thickness non retracted tear of the anterior aspect of
the distal supraspinatus tendon.
2. Tiny intrasubstance tear of the distal subscapularis tendon.
3. Slight subluxation of the long head of the biceps tendon at the
top of the bicipital groove. Degeneration and hypertrophy of soft
tissues overlying the proximal biceps tendon in the bicipital
groove.
4. Moderate glenohumeral joint effusion with a small loose body in
the anterior aspect of the subdeltoid bursa.

## 2020-04-07 DIAGNOSIS — N529 Male erectile dysfunction, unspecified: Secondary | ICD-10-CM | POA: Insufficient documentation

## 2020-04-07 DIAGNOSIS — R972 Elevated prostate specific antigen [PSA]: Secondary | ICD-10-CM | POA: Insufficient documentation

## 2020-04-07 DIAGNOSIS — R3129 Other microscopic hematuria: Secondary | ICD-10-CM | POA: Insufficient documentation

## 2020-05-06 DIAGNOSIS — R399 Unspecified symptoms and signs involving the genitourinary system: Secondary | ICD-10-CM | POA: Insufficient documentation

## 2020-08-08 DIAGNOSIS — C61 Malignant neoplasm of prostate: Secondary | ICD-10-CM | POA: Insufficient documentation

## 2021-05-07 ENCOUNTER — Ambulatory Visit: Payer: 59 | Admitting: Podiatry

## 2021-05-21 ENCOUNTER — Encounter: Payer: Self-pay | Admitting: Podiatry

## 2021-05-21 ENCOUNTER — Other Ambulatory Visit: Payer: Self-pay

## 2021-05-21 ENCOUNTER — Ambulatory Visit (INDEPENDENT_AMBULATORY_CARE_PROVIDER_SITE_OTHER): Payer: 59 | Admitting: Podiatry

## 2021-05-21 DIAGNOSIS — B353 Tinea pedis: Secondary | ICD-10-CM

## 2021-05-21 DIAGNOSIS — D2372 Other benign neoplasm of skin of left lower limb, including hip: Secondary | ICD-10-CM | POA: Diagnosis not present

## 2021-05-21 DIAGNOSIS — D2371 Other benign neoplasm of skin of right lower limb, including hip: Secondary | ICD-10-CM

## 2021-05-21 DIAGNOSIS — L84 Corns and callosities: Secondary | ICD-10-CM

## 2021-05-21 MED ORDER — TERBINAFINE HCL 250 MG PO TABS: 250.0000 mg | ORAL_TABLET | Freq: Every day | ORAL | 0 refills | Status: AC

## 2021-05-21 NOTE — Progress Notes (Signed)
Subjective:  Patient ID: Ronnie Harrington, male    DOB: 1961-12-08,  MRN: [id number] HPI Chief Complaint  Patient presents with  . Skin Problem    Bilateral feet - rash, burning, itching x several months, between 4th and 5th toes right, tried multiple creams  . New Patient (Initial Visit)    60 y.o. male presents with the above complaint.   ROS: Denies fever chills nausea vomiting muscle aches pains calf pain back pain chest pain shortness of breath.  Past Medical History:  Diagnosis Date  . Asthma   . Back pain   . Hypertension    Past Surgical History:  Procedure Laterality Date  . APPENDECTOMY      Current Outpatient Medications:  .  oxyCODONE-acetaminophen (PERCOCET) 10-325 MG tablet, Take 1 tablet by mouth every 4 (four) hours as needed for pain., Disp: , Rfl:  .  terbinafine (LAMISIL) 250 MG tablet, Take 1 tablet (250 mg total) by mouth daily., Disp: 30 tablet, Rfl: 0 .  acetaminophen (TYLENOL) 325 MG tablet, Take 325-650 mg by mouth every 6 (six) hours as needed., Disp: , Rfl:  .  albuterol (VENTOLIN HFA) 108 (90 Base) MCG/ACT inhaler, Inhale into the lungs., Disp: , Rfl:  .  amLODipine (NORVASC) 5 MG tablet, Take 1 tablet by mouth daily., Disp: , Rfl:  .  fluticasone (FLONASE) 50 MCG/ACT nasal spray, Place 2 sprays into both nostrils daily., Disp: , Rfl:  .  gabapentin (NEURONTIN) 400 MG capsule, Take by mouth., Disp: , Rfl:  .  ibuprofen (ADVIL) 800 MG tablet, Take 800 mg by mouth 3 (three) times daily., Disp: , Rfl:  .  loratadine (CLARITIN) 10 MG tablet, Take 10 mg by mouth daily., Disp: , Rfl:  .  silodosin (RAPAFLO) 8 MG CAPS capsule, Take 8 mg by mouth daily., Disp: , Rfl:  .  tadalafil (CIALIS) 5 MG tablet, Take 5 mg by mouth daily., Disp: , Rfl:  .  traZODone (DESYREL) 50 MG tablet, Take 50 mg by mouth at bedtime., Disp: , Rfl:  .  venlafaxine XR (EFFEXOR-XR) 37.5 MG 24 hr capsule, Take 37.5 mg by mouth daily., Disp: , Rfl:  .  zolpidem (AMBIEN CR) 6.25 MG CR  tablet, Take 6.25 mg by mouth at bedtime as needed., Disp: , Rfl:   Allergies  Allergen Reactions  . Tamsulosin Hives   Review of Systems Objective:  There were no vitals filed for this visit.  General: Well developed, nourished, in no acute distress, alert and oriented x3   Dermatological: Skin is warm, dry and supple bilateral. Nails x 10 are well maintained; remaining integument appears unremarkable at this time. There are no open sores, no preulcerative lesions, no rash or signs of infection present.  He has developed what appears to be tinea pedis to different sites of his foot interdigitally as well as dorsally particular dorsal hallux.  However is difficult to tell with his skin tone whether this could be an eczematous dermatitis or just a tinea pedis.  He does have a soft corn between the fourth and fifth toes of the right foot without any surrounding bacterial infection.   Vascular: Dorsalis Pedis artery and Posterior Tibial artery pedal pulses are 2/4 bilateral with immedate capillary fill time. Pedal hair growth present. No varicosities and no lower extremity edema present bilateral.   Neruologic: Grossly intact via light touch bilateral. Vibratory intact via tuning fork bilateral. Protective threshold with Semmes Wienstein monofilament intact to all pedal sites bilateral. Patellar and Achilles deep  tendon reflexes 2+ bilateral. No Babinski or clonus noted bilateral.   Musculoskeletal: No gross boney pedal deformities bilateral. No pain, crepitus, or limitation noted with foot and ankle range of motion bilateral. Muscular strength 5/5 in all groups tested bilateral.  Gait: Unassisted, Nonantalgic.    Radiographs:  None taken  Assessment & Plan:   Assessment: Tinea pedis or eczematous dermatitis.  Heloma molle fourth fifth right  Plan: Debrided the reactive hyperkeratotic tissue and the heloma molle with no iatrogenic lesions.  Started him on 30 days of Lamisil looked up  previous liver profile performed 2021 which was perfectly normal.  I will follow-up with him in about 6 weeks     Zanna Hawn T. [place], [place]

## 2021-06-25 ENCOUNTER — Ambulatory Visit: Payer: 59 | Admitting: Podiatry

## 2021-09-11 ENCOUNTER — Other Ambulatory Visit: Payer: Self-pay | Admitting: Podiatry

## 2021-12-02 ENCOUNTER — Other Ambulatory Visit: Payer: Self-pay | Admitting: Podiatry
